# Patient Record
Sex: Female | Born: 1960 | Race: White | Hispanic: No | Marital: Married
Health system: Southern US, Community
[De-identification: ages and names within clinical notes are randomized; demographics above are authoritative.]

---

## 2000-12-30 ENCOUNTER — Encounter (INDEPENDENT_AMBULATORY_CARE_PROVIDER_SITE_OTHER): Payer: Self-pay | Admitting: Specialist

## 2000-12-30 ENCOUNTER — Ambulatory Visit (HOSPITAL_BASED_OUTPATIENT_CLINIC_OR_DEPARTMENT_OTHER): Admission: RE | Admit: 2000-12-30 | Discharge: 2000-12-30 | Payer: Self-pay | Admitting: Obstetrics and Gynecology

## 2002-04-06 ENCOUNTER — Encounter: Payer: Self-pay | Admitting: General Surgery

## 2002-04-06 ENCOUNTER — Encounter (INDEPENDENT_AMBULATORY_CARE_PROVIDER_SITE_OTHER): Payer: Self-pay | Admitting: *Deleted

## 2002-04-06 ENCOUNTER — Observation Stay (HOSPITAL_COMMUNITY): Admission: RE | Admit: 2002-04-06 | Discharge: 2002-04-07 | Payer: Self-pay | Admitting: General Surgery

## 2004-04-02 ENCOUNTER — Emergency Department (HOSPITAL_COMMUNITY): Admission: EM | Admit: 2004-04-02 | Discharge: 2004-04-02 | Payer: Self-pay | Admitting: Family Medicine

## 2004-09-13 ENCOUNTER — Ambulatory Visit (HOSPITAL_COMMUNITY): Admission: RE | Admit: 2004-09-13 | Discharge: 2004-09-13 | Payer: Self-pay | Admitting: Gastroenterology

## 2004-10-13 ENCOUNTER — Ambulatory Visit: Payer: Self-pay | Admitting: Internal Medicine

## 2004-10-13 ENCOUNTER — Inpatient Hospital Stay (HOSPITAL_COMMUNITY): Admission: EM | Admit: 2004-10-13 | Discharge: 2004-10-17 | Payer: Self-pay | Admitting: Emergency Medicine

## 2004-10-20 ENCOUNTER — Emergency Department (HOSPITAL_COMMUNITY): Admission: EM | Admit: 2004-10-20 | Discharge: 2004-10-20 | Payer: Self-pay | Admitting: Emergency Medicine

## 2004-10-22 ENCOUNTER — Emergency Department (HOSPITAL_COMMUNITY): Admission: EM | Admit: 2004-10-22 | Discharge: 2004-10-22 | Payer: Self-pay | Admitting: Family Medicine

## 2005-02-28 ENCOUNTER — Encounter: Admission: RE | Admit: 2005-02-28 | Discharge: 2005-02-28 | Payer: Self-pay | Admitting: Gastroenterology

## 2005-12-09 ENCOUNTER — Ambulatory Visit (HOSPITAL_COMMUNITY): Admission: RE | Admit: 2005-12-09 | Discharge: 2005-12-09 | Payer: Self-pay | Admitting: Gastroenterology

## 2006-06-15 ENCOUNTER — Emergency Department (HOSPITAL_COMMUNITY): Admission: EM | Admit: 2006-06-15 | Discharge: 2006-06-15 | Payer: Self-pay | Admitting: Family Medicine

## 2006-08-09 IMAGING — CT CT ABDOMEN W/ CM
1 of 3 series · 13 of 32 positions shown, 18 images · IV contrast (100 ML OMNI 300)
Comparison: None.

CLINICAL DATA: Crohn's disease.  Abdominal pain.  History of melanoma.  Swallowed EndoCapsule camera, which has not passed.  Evaluate for bowel obstruction.
ABDOMEN CT WITH CONTRAST:
TECHNIQUE: Multidetector CT imaging of the abdomen was performed following the standard protocol during bolus administration of intravenous contrast.
Contrast:  100 cc Omnipaque 300 and oral contrast.
TECHNIQUE: Multidetector CT imaging of the pelvis was performed following the standard protocol during bolus administration of intravenous contrast.
Mild dilatation of fluid-filled small bowel loops is seen in the lower abdomen and pelvis.  Bowel wall thickening is seen involving the distal ileum, consistent with the patient's diagnosis of Crohn's disease.  An EndoCapsule camera is seen within the distal portion of the ileum at approximately 25 cm proximal to the ileocecal valve. There is no evidence of free intraperitoneal air.  There is no evidence of abscess.  There is a small amount of free fluid seen in the right adnexa and pelvic cul-de-sac.  No abnormal soft tissue masses are seen.

[Series 2: routine abdomen · axial · 0.73mm/px · z∈[-425,-45]mm · 13 of 86 slices shown, 18 images]
[im 5/86  soft-tissue]
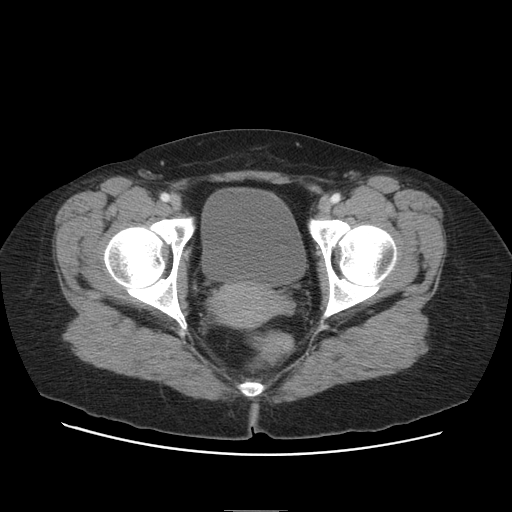
[im 5/86  bone]
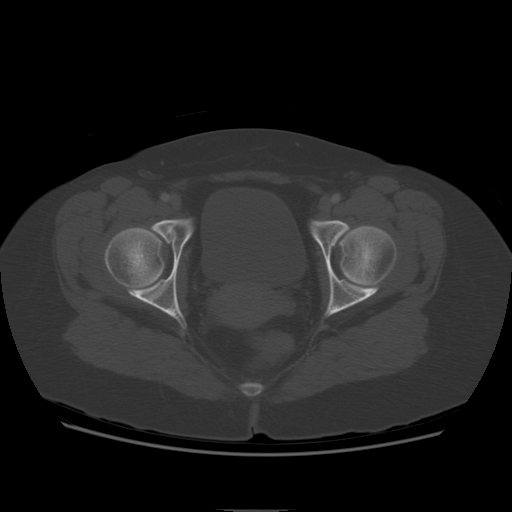
[im 14/86  soft-tissue]
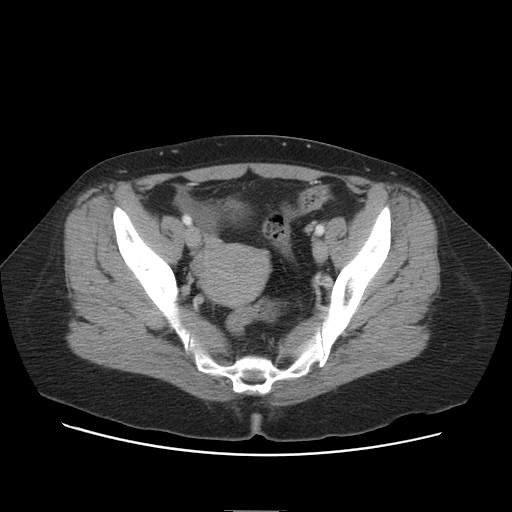
[im 18/86  soft-tissue]
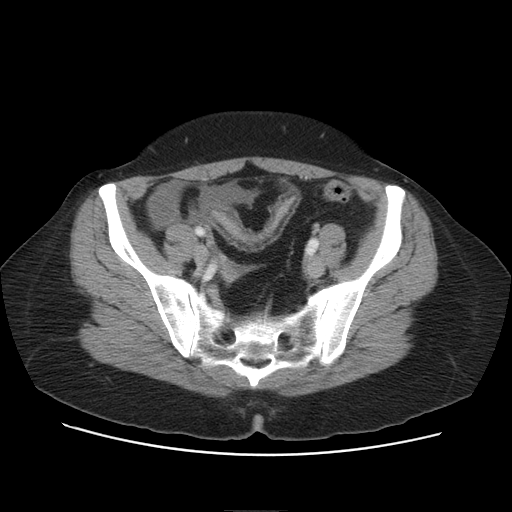
[im 27/86  soft-tissue]
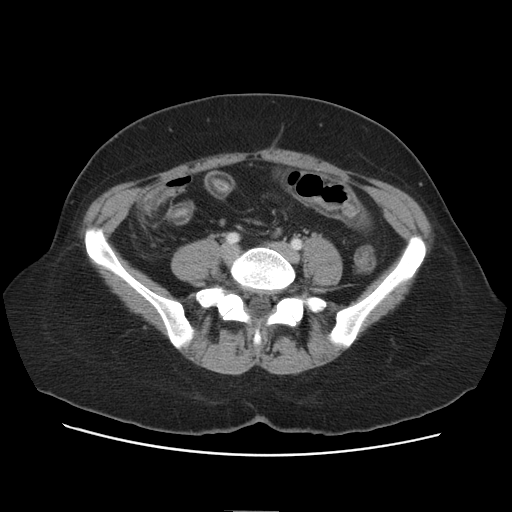
[im 32/86  soft-tissue]
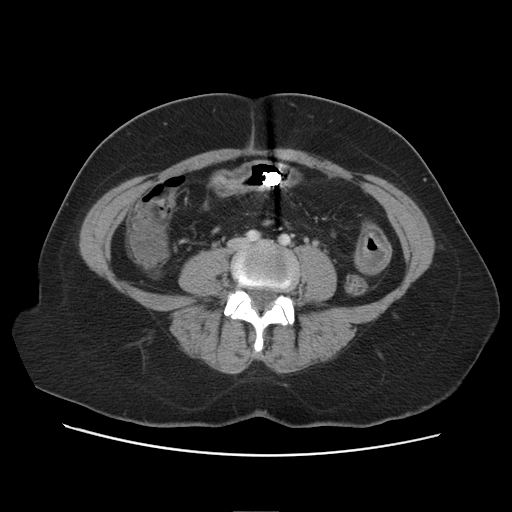
[im 41/86  soft-tissue]
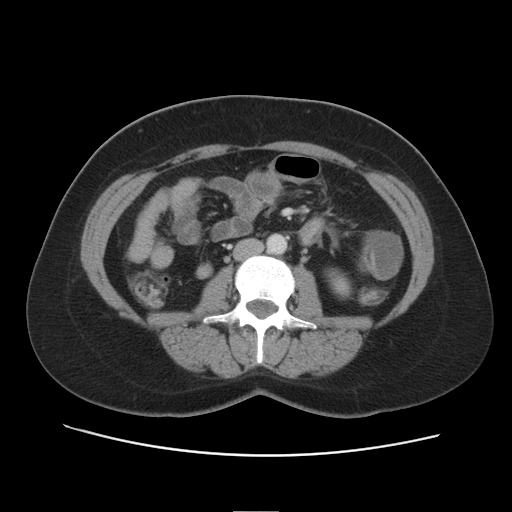
[im 45/86  soft-tissue]
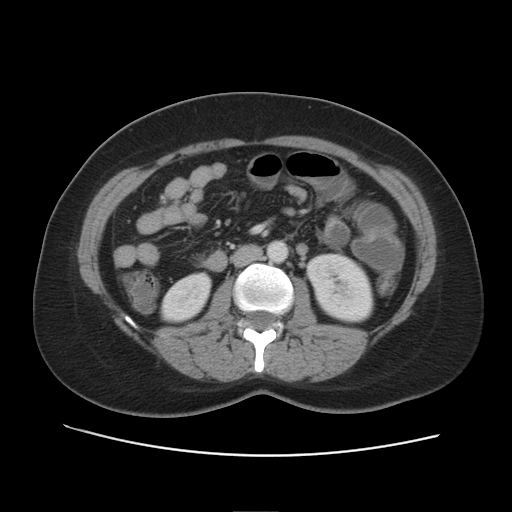
[im 54/86  soft-tissue]
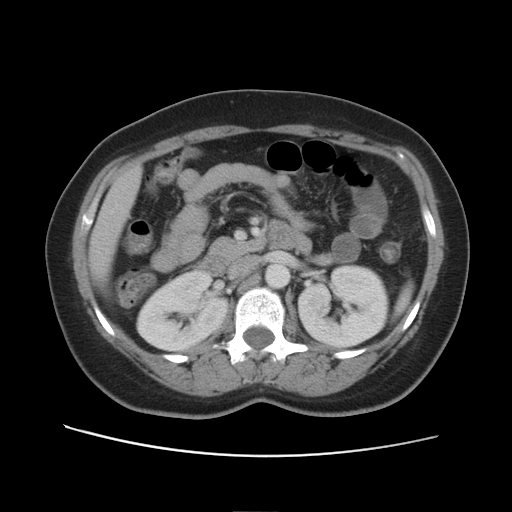
[im 59/86  soft-tissue]
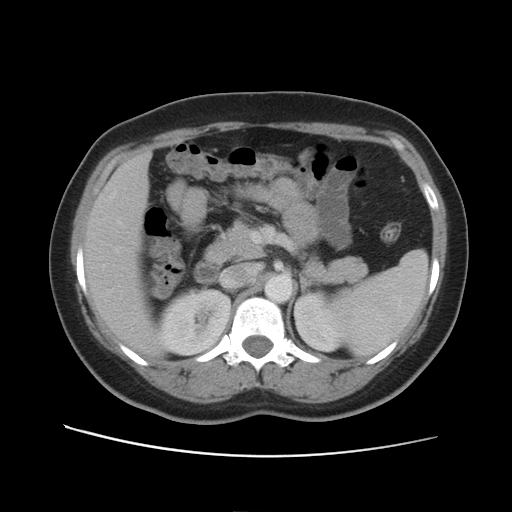
[im 59/86  bone]
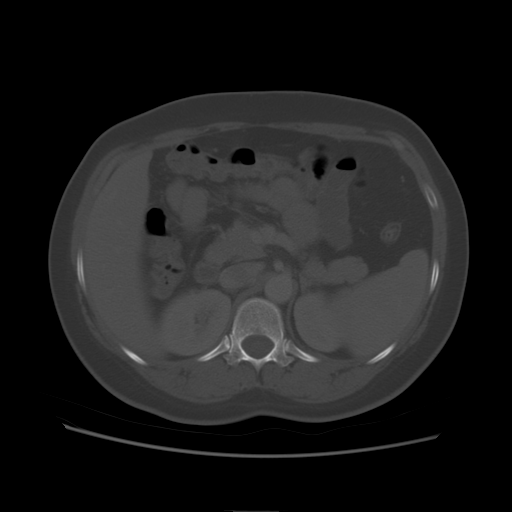
[im 68/86  soft-tissue]
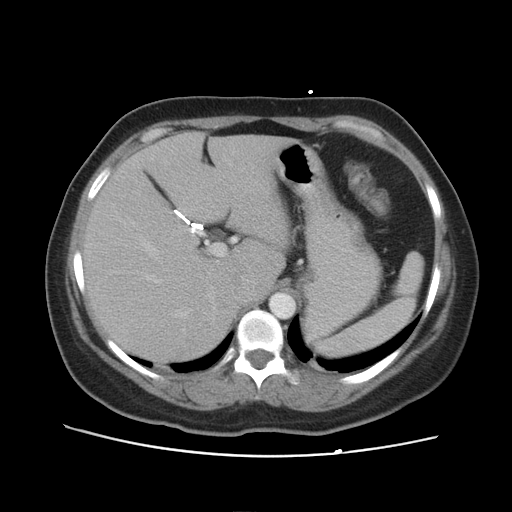
[im 68/86  lung]
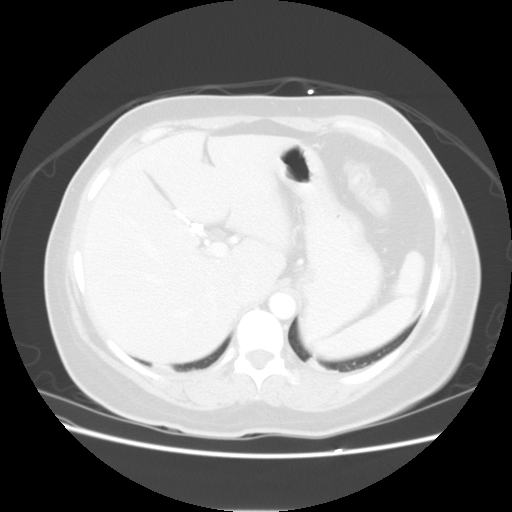
[im 72/86  soft-tissue]
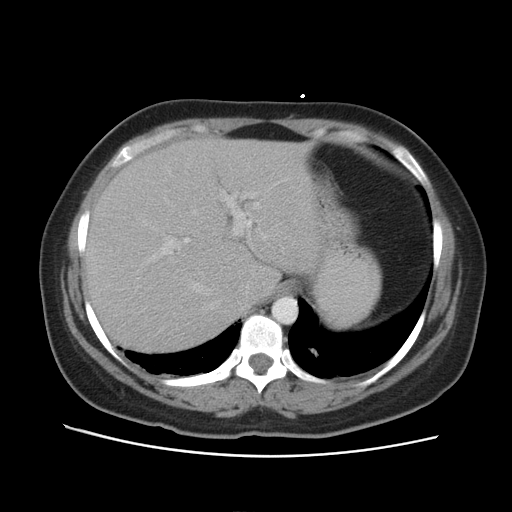
[im 72/86  lung]
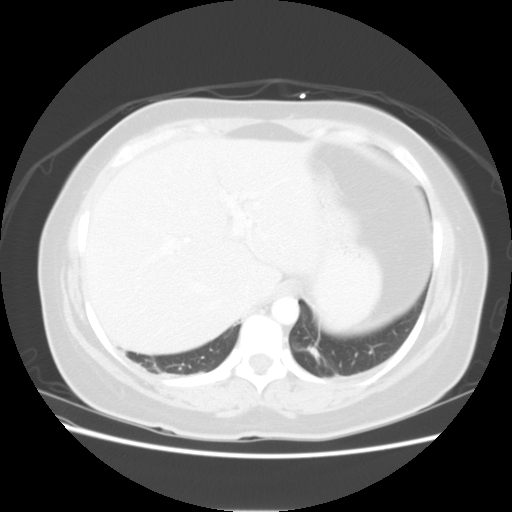
[im 77/86  lung]
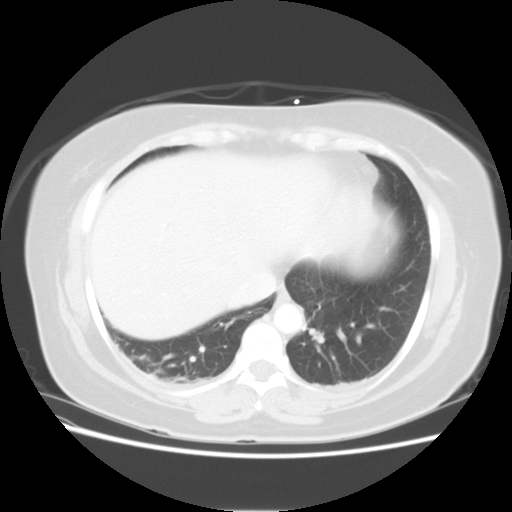
[im 81/86  soft-tissue]
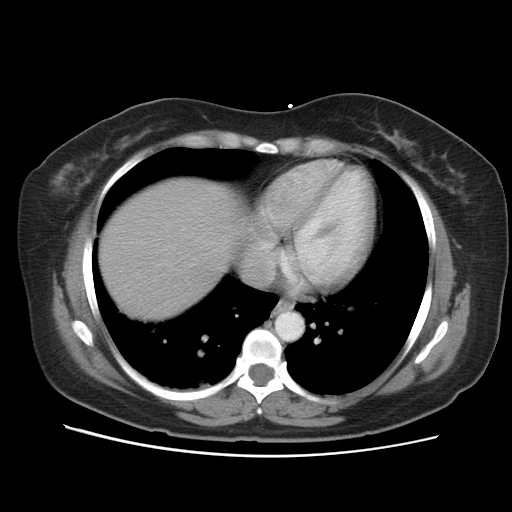
[im 81/86  lung]
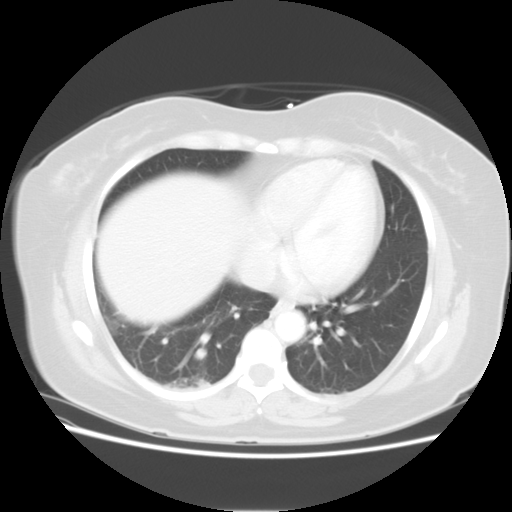

[13 of 32 positions shown; findings below may reference images not displayed]

Cervical clips are seen from prior cholecystectomy.  There is no evidence of biliary ductal dilatation.  The liver, spleen, pancreas, adrenal glands, and kidneys are normal in appearance.  There is no evidence of mass or adenopathy.  No inflammatory changes are seen within the abdomen, and there is no evidence of abnormal fluid collections.  Mild dilatation of fluid-filled small bowel loops is seen in the lower abdomen.
IMPRESSION: Mildly dilated small bowel loops in the lower abdomen.  See pelvis CT report below.
PELVIS CT WITH CONTRAST:
IMPRESSION: 1.  Small bowel wall thickening involving terminal and distal ileum, consistent with history of Crohn's disease.  Mildly dilated proximal small bowel loops, consistent with partial small bowel obstruction. 
2.  EndoCapsule camera seen in distal ileum, approximately 25 cm proximal to the ileocecal valve. 
3.  Small amount of free pelvic fluid. No evidence of abscess.

## 2006-08-10 IMAGING — CR DG ABDOMEN 1V
1 series · 1 of 1 positions shown · non-contrast
Comparison: CT 10/13/2004

CLINICAL DATA: Crohn's disease

ABDOMEN - 1 VIEW

[view not recorded]
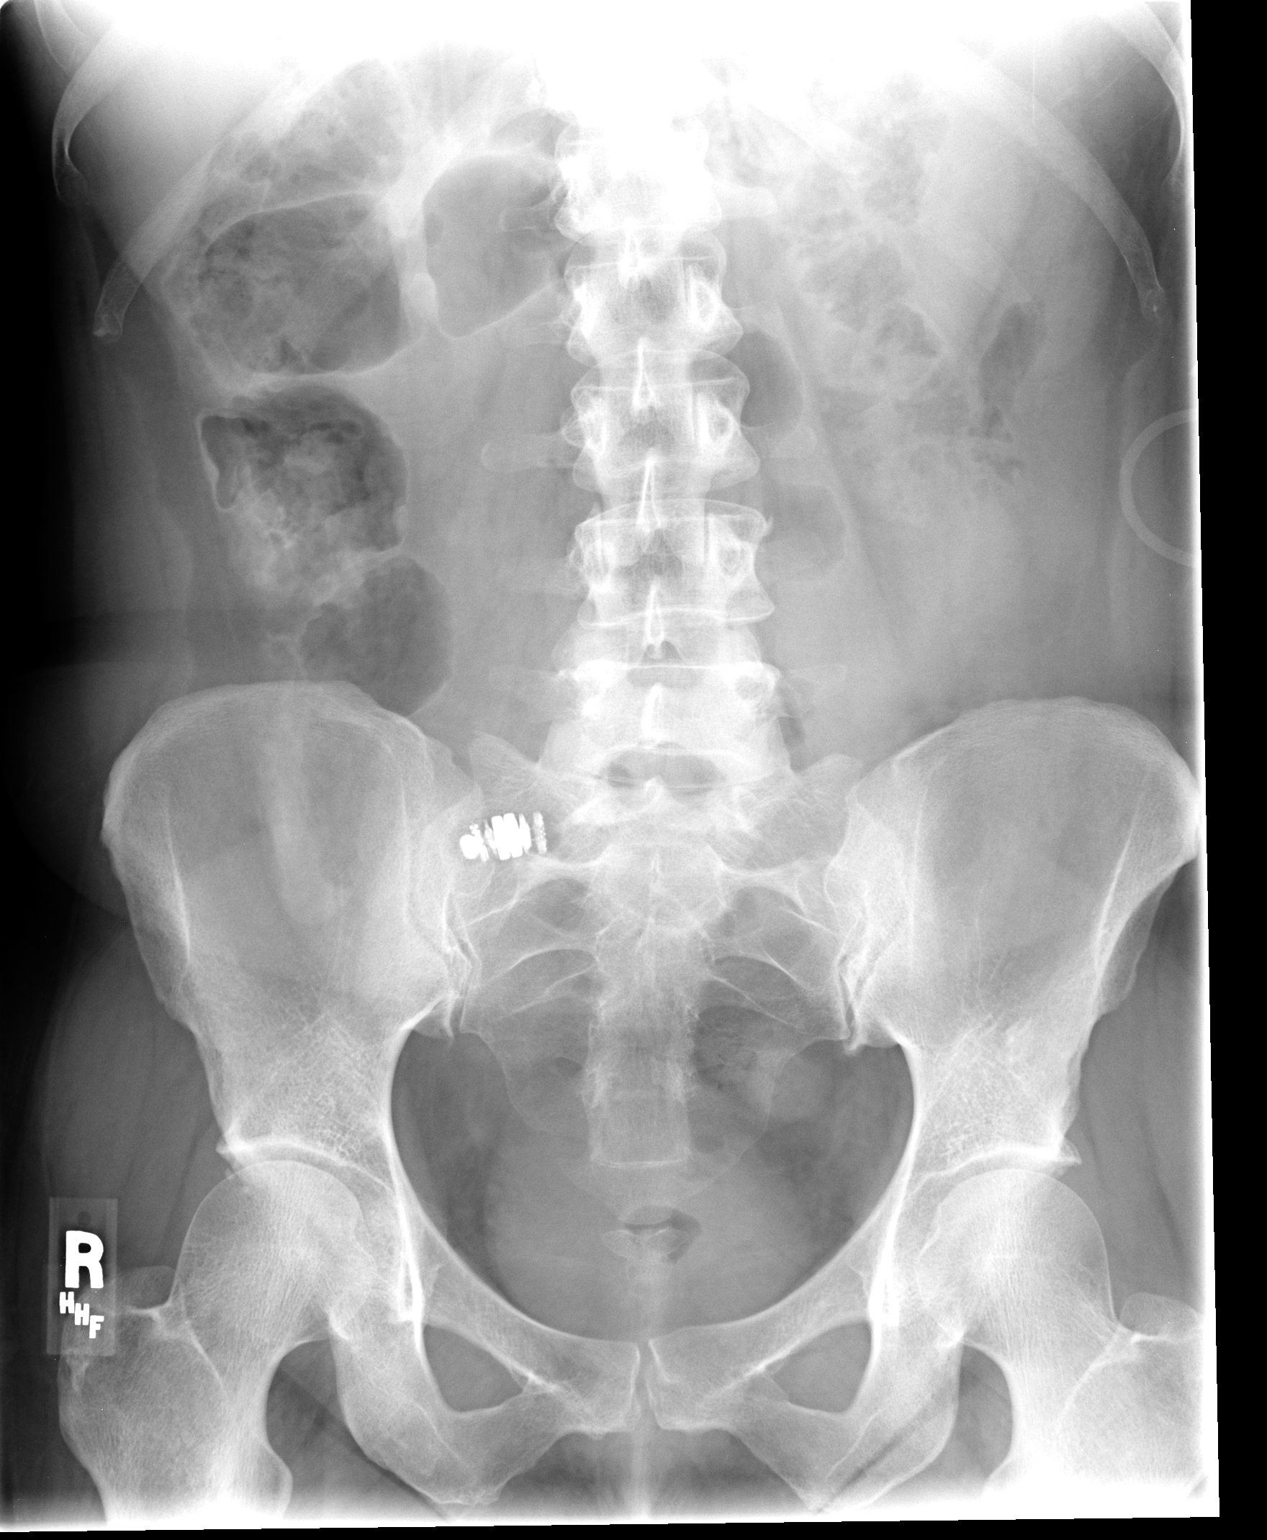

[1 of 1 positions shown; findings below may reference images not displayed]

FINDINGS: Nonobstructive bowel gas pattern. Gas noted within the colon. The
endocapsule camera is present in the right lower quadrant, further downstream
than on prior CT, but appears to still be within the distal ileum.

IMPRESSION

Camera slightly further downstream but still within the ileum. No obstruction.

## 2006-08-11 IMAGING — CR DG CHEST 1V PORT
1 series · 1 of 1 positions shown · non-contrast
Comparison: No similar prior study is available for direct comparison at this time.

CLINICAL DATA: PICC line placement.
 PORTABLE CHEST ? 1 VIEW:

[view not recorded]
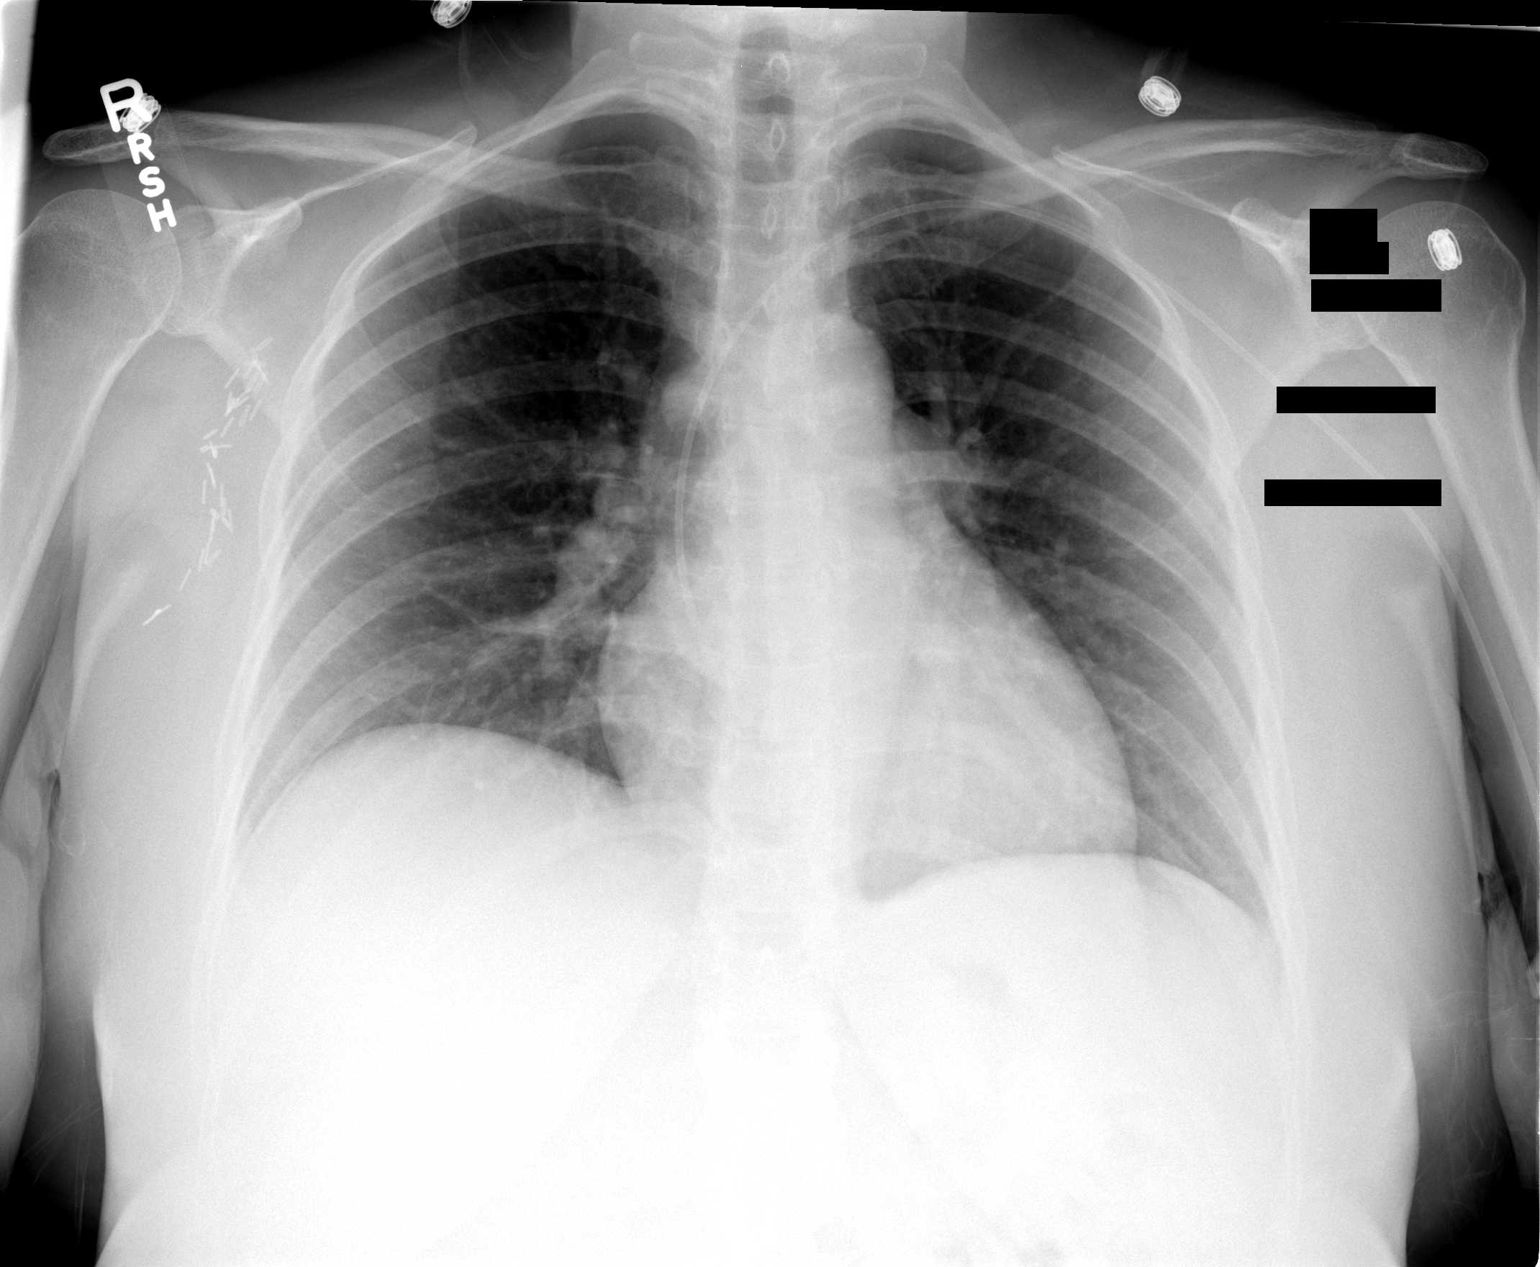

[1 of 1 positions shown; findings below may reference images not displayed]

FINDINGS: Left-sided PICC line has been placed which terminates over the right atrium.  This may be withdrawn approximately 3 cm for optimal positioning.  Cardiomediastinal silhouette is within normal limits.  Lung volumes are low, but otherwise clear.  Clips are seen over the right axilla.  No pneumothorax is present.
IMPRESSION: PICC line terminates over the right atrium and may be withdrawn approximately 3 cm for optimal positioning.

## 2007-05-09 ENCOUNTER — Emergency Department (HOSPITAL_COMMUNITY): Admission: EM | Admit: 2007-05-09 | Discharge: 2007-05-09 | Payer: Self-pay | Admitting: Emergency Medicine

## 2009-09-10 ENCOUNTER — Emergency Department (HOSPITAL_COMMUNITY): Admission: EM | Admit: 2009-09-10 | Discharge: 2009-09-10 | Payer: Self-pay | Admitting: Family Medicine

## 2010-07-12 NOTE — Op Note (Signed)
[facility]  Patient:    Nancy Macias, Nancy Macias Visit Number: [id number] MRN: [id number]          Service Type: NES Location: [facility] Attending Physician:  Lendon Colonel Dictated by:   Kathie Rhodes. Kyra Manges, M.D. Proc. Date: 12/30/00 Admit Date:  12/30/2000                             Operative Report  PREOPERATIVE DIAGNOSIS:  Menorrhagia.  POSTOPERATIVE DIAGNOSIS:  Menorrhagia.  OPERATION:  Endometrial resection.  SURGEON:  Katherine Roan, M.D.  DESCRIPTION OF PROCEDURE:  The patient was placed in the lithotomy position, prepped and draped in the usual fashion.  The cervix was small and the uterus was retroverted, dilated carefully, and hysteroscope was inserted into the endometrial cavity.  Visualization of the uterus revealed a polypoid hyperplasia posteriorly which was resected.  The endometrium was quite vascular.  All the resected material was removed and the areas underneath the resection were then cauterized.  No unusual blood loss occurred.  The relaxation was obvious, so if heavy periods continue, then a vaginal hysterectomy could easily be done.  At the termination of the procedure, there was no unusual blood loss.  She was awakened and carried to the recovery room in good condition. Dictated by:   S. Kyra Manges, M.D. Attending Physician:  Lendon Colonel DD:  12/30/00 TD:  12/31/00 Job: [id number]

## 2010-07-12 NOTE — Discharge Summary (Signed)
Nancy Macias, Nancy Macias               ACCOUNT NO.:  [id number]   MEDICAL RECORD NO.:  [id number]          PATIENT TYPE:  INP   LOCATION:  6713                         FACILITY:  [facility]   PHYSICIAN:  Ileana Roup, M.D.  DATE OF BIRTH:  [birth year]   DATE OF ADMISSION:  10/13/2004  DATE OF DISCHARGE:  10/17/2004                                 DISCHARGE SUMMARY   DISCHARGE DIAGNOSES:  1.  Small bowel obstruction secondary to retained capsule from capsule      endoscopy.  2.  Crohn's disease.  3.  Hypokalemia.  4.  Hypertension.  5.  Anemia.   DISCHARGE MEDICATIONS:  1.  Protonix 40 mg p.o. daily.  2.  Prednisone 40 mg p.o. daily.  3.  Phenergan 12.5 to 25 mg p.o. q.4h. p.r.n.  4.  The patient was instructed to hold her enterocort until she follows up      with Dr. Ewing Schlein.   DISCHARGE INSTRUCTIONS:  1.  The patient was instructed to follow up with Dr. Ewing Schlein at [facility] on October 21, 2004.  She should also get an abdominal x-      ray on October 21, 2004.  2.  She was also instructed that her blood pressure was elevated, and that      she should have this rechecked once she leaves the hospital to evaluate      her for a potential negative diagnosis of hypertension.  3.  She was also advised to return to the emergency room should there be any      return of her nausea, vomiting, or severe abdominal pain, as she is at      high likelihood for a recurrence of her small bowel obstruction, given      the fact that the camera did not pass at the time of discharge.   PROCEDURES:  Acute abdominal x-ray on October 13, 2004 shows a capsule camera  in the right lower quadrant, probably in the terminal ileum, as well as  scattered small bowel fluid levels representing a low-grade partial small  bowel obstruction related to the camera.  A CT of the abdomen and pelvis on  October 13, 2004 shows mildly dilated small bowel loops in the lower abdomen,  and small bowel wall  thickening involving the terminal and distal ileum  consistent with Crohn's, as well as mildly dilated proximal small bowel  loops consistent with a partial small bowel obstruction.  The endocapsule  camera was seen in the distal ileum approximately 25 cm proximal to the  ileocecal valve, and abdominal x-ray on October 14, 2004 shows the  endocapsule camera slightly further downstream, but still within the ileum;  no obstruction.  A portable chest x-ray on October 15, 2004 shows the PICC  line in the right atrium needing to be withdrawn 3 cm per optimal  positioning.  Abdominal x-rays on October 15, 2004, October 16, 2004, October 17, 2004, and October 20, 2004 all showed the endoscopic camera remaining in  the region of the terminal ileum.  The patient also  had a PICC line placed  on October 15, 2004.  This was discontinued at the time of discharge.   CONSULTATIONS:  1.  Dr. Luther Parody and Dr. Ewing Schlein from [facility] were consulted      in regard to the patient's Crohn's disease and retaining the endocapsule      camera from her capsule endoscopy.  2.  Dr. Daphine Deutscher and Dr. Derrell Lolling of [facility] were consulted in      the likelihood that the patient would have to have the capsule resected.      The patient did not require any surgical intervention to relieve her      small bowel obstruction due to a foreign body and complicated by the      diagnosis of Crohn's disease.   BRIEF ADMITTING HISTORY AND PHYSICAL:  Nancy Macias is a 50 year old white  female who began to have nausea, vomiting, and abdominal pain starting 2-3  days prior to admission.  The pain became increasingly worse, and she  vomited several times on the day prior to admission.  She does report  recently being diagnosed with Crohn's disease on October 04, 2004 by Dr.  Kinnie Scales, and stated that this diagnosis was made by capsule endoscopy, and  that she did not notice passing the capsule endoscope.   PHYSICAL  EXAMINATION ON ADMISSION:  VITAL SIGNS:  The patient, on admission,  had a temperature of 98.0, blood pressure 145/80, pulse 103, respirations  20, 97% on room air.  GENERAL:  She was in moderate distress, awake and alert, but vomiting and  dry heaving during the exam.  HEENT:  Eyes - pupils equal, reactive to light.  Extraocular muscles were  intact.  Sclerae were clear.  ENT showed nares and oropharynx that were  clear.  NECK:  Supple, nontender.  No thyromegaly.  LUNGS:  Clear to auscultation bilaterally.  CARDIOVASCULAR:  A regular rate and rhythm with no murmurs.  Occasionally  tachycardic.  ABDOMEN:  An abdomen that was soft with good bowel sounds, but diffusely  tender in all 4 quadrants.  No hepatosplenomegaly.  EXTREMITIES:  Without edema.  NEUROLOGIC:  Cranial nerves II-XII were intact.  Strength 5/5, and sensation  intact.   ADMISSION LABORATORIES:  White blood cell count of 11.9, hemoglobin 13.1,  hematocrit 40, platelets 431.  Sodium 137, potassium 3.2, chloride 106,  bicarbonate 23, BUN 12, creatinine 0.6, and platelets of 126.  Total  bilirubin 0.4, alkaline phosphatase 107, SGOT 13, SGPT 21, protein 6.4,  albumin 2.9.  An abdominal x-ray showed fluid levels and borderline  distention with small bowel obstruction with the capsule camera in the right  lower quadrant.   HOSPITAL COURSE BY PROBLEM:  1.  Small bowel obstruction.  The small bowel obstruction was felt to be      secondary to a retained capsule endoscope camera, as well as her      underlying Crohn's disease.  An abdominal plain film did show air fluid      levels consistent with small bowel obstruction, as well as the retained      endoscopic camera in the right lower quadrant.  A CT confirmed these      findings, and placed the endoscopic camera approximately 25 cm proximal      to the ileocecal valve.  In regards to the management of her small bowel     obstruction, the patient was placed NPO.  Her nausea  and vomiting were  controlled with Phenergan and Zofran, and her pain was controlled with      morphine.  she was started on Solu-Medrol 60 mg IV daily.  Attempts were      made to pass an NG tube; however, after several attempts in both      nostrils with multiple tube sizes including pediatric tubes, the NG tube      could not be placed.  At this point, GI and surgery consults were      placed.  Dr. Luther Parody came and evaluated the patient, at which point      she was feeling better and was no longer nauseated and no longer      vomiting.  He recommended holding the NG tube, but with resumed attempts      to place it, should the patient have a recurrence of her nausea and      vomiting.  He also recommended increasing the Solu-Medrol to 60 mg IV      q.12h., which was done, and he also added Cipro and Flagyl IV as further      management of her Crohn's disease.  During the hospitalization, she was      followed with daily abdominal films to track any movement of the      endoscopic camera.  Her diet was advanced as tolerated, and her stools      were checked for any evidence of passing of the camera.  She was      eventually changed over to oral prednisone 40 mg daily, and during the      course of her hospitalization never did pass the endoscopic camera.  At      the time of discharge, she was feeling very well with no nausea, no      vomiting, and desired to go home.  In discussions with Dr. Ewing Schlein, it was      felt that she could follow up with him as an outpatient, and continue to      get frequent abdominal films for any evidence of passing of the camera.      She was instructed to return immediately to the emergency room if she      should have any recurrence of the nausea, vomiting, and abdominal pain      that brought her in during this hospitalization in the first place.      Again, the patient will follow up with Dr. Ewing Schlein in regards to further      management of her Crohn's  disease, as well as management of how to      retrieve the capsule camera.  2.  Crohn's disease.  The patient came in with a recent diagnosis of Crohn's      disease on enterocort.  The enterocort was held, and the Crohn's disease      was managed with IV Solu-Medrol, Cipro, and Flagyl.  At the time of      discharge, the patient was sent out on prednisone 40 mg daily.  The      Flagyl and Cipro were discontinued.  Her white blood cell count trended      down during the course of the hospitalization, and she will follow up      with Dr. Ewing Schlein for further management of her Crohn's disease.  3.  Hypokalemia.  The patient came in hypokalemic with a potassium of 3.2.      This was repleted several times during the course  of her      hospitalization, and felt to be due to her nausea and vomiting.  Her      potassium fully trended up and was stable at the time of discharge.  4.  Volume depletion secondary to nausea and vomiting.  The patient was     placed on IV fluids and was felt to be euvolemic at the time of      discharge.  5.  Hypertension.  The patient was admitted with a blood pressure of 145/80,      and no previous diagnosis of hypertension.  Her blood pressure as within      normal limits throughout the course of her hospitalization until the day      of discharge, at which time her blood pressure was systolics in the 150s      to 180s.  There was felt to be some component of acute stress, and in a      patient with no previous history of hypertension, we felt it would be      best if she had her blood pressure rechecked on an outpatient basis to      confirm a diagnosis of hypertension before being started on any      antihypertensives.  She will have her blood pressure checked by Dr.      Ewing Schlein at her hospital follow up appointment.  6.  Anemia.  The patient came in with a normal hemoglobin of 13.1; however,      after receiving large amounts of IV fluids, her hemoglobin trended  down      to a nadir of 9.8.  However, at the time of discharge, the patient's      hemoglobin had risen to 11.1 without any intervention.  She does have a      history of anemia, and received B-12 shots as an outpatient.  One B-12      shot was given in house, and she should have this followed up on as an      outpatient.   DISCHARGE LABORATORIES AND VITALS:  At the time of discharge, the patient  had a CBC which showed a white blood cell count of 11.2, hemoglobin 11.1,  hematocrit 33.1, platelets of 335.  A BMET showed a sodium of  141, potassium 3.9, chloride 106, bicarbonate 26, BUN 8, creatinine 0.6, and  a glucose of 141 with a calcium of 8.4.  Her vitals on the day of discharge  include a temperature of 98.3, pulse 65, respirations 18, and a blood  pressure of 183/93.  Oxygen saturations were 99% on room air.  There are no  pending laboratories at the time of discharge.      Inis Sizer, M.D.    ______________________________  Ileana Roup, M.D.    DC/MEDQ  D:  11/06/2004  T:  11/06/2004  Job:  [id number]   cc:   Outpatient Clinic   Althea Grimmer. Luther Parody, M.D.  [address]  [place]  [place] [address]  Fax: [phone]   Petra Kuba, M.D.  [address]  [place]  [place] [address]  Fax: [phone]   Dr. Daphine Deutscher   Dr. Derrell Lolling, [facility]   Griffith Citron, M.D.  [facility] [place]  [place] [address]  Fax: [phone]

## 2010-07-12 NOTE — Consult Note (Signed)
NAMECERINITY, Nancy Macias               ACCOUNT NO.:  [id number]   MEDICAL RECORD NO.:  [id number]          PATIENT TYPE:  INP   LOCATION:  3315                         FACILITY:  [facility]   PHYSICIAN:  Althea Grimmer. Santogade, M.D.DATE OF BIRTH:  1960/08/30   DATE OF CONSULTATION:  DATE OF DISCHARGE:                                   CONSULTATION   DATE OF GASTROENTEROLOGY CONSULTATION:  October 13, 2004.   REASON FOR CONSULTATION:  Nancy Macias is a 50 year old female, who presented  to the emergency room with abdominal distention, bloating, nausea, vomiting,  and pain in the mid right abdomen mostly.  She reports that she has been  diagnosed with Crohn's disease of the terminal ileum on capsule endoscopy  performed by Dr. Kinnie Scales on July 21st.  An x-ray in the emergency room shows  a small bowel obstruction with the capsule in the terminal ileal area.  A  followup CT scan demonstrates that the capsule is lodged a few loops  proximal to the terminal ileum.  Patient has not passed gas today.  She  passed a small liquid stool early this morning.  She reports that the  vomiting started Friday night and got progressively worse.  Within the last  hour, she had significant vomiting and feels considerably better at present.  Several attempts at NG-tube were unsuccessful.  She reports to me that she  has had gastrointestinal problems for years with pain on the right side.  Upper and lower endoscopy reportedly were negative by Dr. Kinnie Scales in  approximately 2002.  In 2004, a gallstone was discovered and she underwent  laparoscopic cholecystectomy.  She seemed better briefly, but then the pains  recurred, and the diagnosis was uncertain until the capsule endoscopy last  month.  She has not vomited any blood or had any hematochezia.  She is not  running any fevers or having chills.   PAST MEDICAL HISTORY:  1.  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  2.  Microhematuria.  3.  Status post laparoscopic  cholecystectomy.   CURRENT MEDICATIONS:  1.  Anticort 3 mg, three q.a.m.  2.  In hospital, she has been started on Solu-Medrol 60 mg daily and Zofran.   ALLERGIES:  None reported.   FAMILY HISTORY:  Negative for inflammatory bowel disease.   REVIEW OF SYSTEMS:  Negative other than above.   PHYSICAL EXAMINATION:  VITAL SIGNS:  She is afebrile.  Blood pressure  124/86, pulse 110.  GENERAL:  She is in no acute distress.  HEENT:  Eyes anicteric.  Oropharynx unremarkable without aphthous  ulcerations.  CHEST:  Clear.  HEART:  Regular rate and rhythm without murmurs.  ABDOMEN:  Currently soft with hypoactive bowel sounds.  There is minimal  tenderness in the right lower quadrant.  RECTAL:  Exam is not performed.  EXTREMITIES:  Without cyanosis, clubbing, edema, or rash.   LABORATORY DATA:  Hemoglobin of 13.1, white blood count of 11.9, platelet  count 439.  Sedimentation rate 13, potassium 3.2 being repleted, albumin  2.9.   IMPRESSION:  A 50 year old female with apparent Crohn's disease  with  stricturing or inflammatory edema of the terminal ileum.  The capsule  endoscope is apparently obstructing the distal non-terminal ileum.   RECOMMENDATIONS:  1.  Re-attempt insertion of the NG-tube if she again becomes distended,      nauseous, or begins vomiting.  2.  Increase Solu-Medrol to b.i.d.  3.  Begin IV ciprofloxacin and Flagyl.  4.  Surgical consultation.  5.  Consider Remicade, which could decrease inflammation rapidly if this is      secondary to inflammation and edema.  However, if steroids and      antibiotics do not alleviate the obstruction, it is unlikely Remicade      will do so and the only alternative will be surgery to remove the      capsule and probably resect the strictured terminal ileum.      Althea Grimmer. Luther Parody, M.D.  Electronically Signed     PJS/MEDQ  D:  10/13/2004  T:  10/13/2004  Job:  [id number]   cc:   Griffith Citron, M.D.  [facility]  [place]  [place] [address]  Fax: [phone]

## 2010-07-12 NOTE — Op Note (Signed)
NAMEASHAKI, FROSCH NO.:  [id number]   MEDICAL RECORD NO.:  [id number]            PATIENT TYPE:   LOCATION:                                 FACILITY:   PHYSICIAN:  Petra Kuba, M.D.         DATE OF BIRTH:   DATE OF PROCEDURE:  12/09/2005  DATE OF DISCHARGE:                                 OPERATIVE REPORT   SURGEON:  Petra Kuba, M.D.   PROCEDURE:  Esophagogastroduodenoscopy.   INDICATIONS:  Reflux and cough, want to evaluate.   CONSENT:  Consent was signed prior to any premeds given, after the risks,  benefits, methods and options were thoroughly discussed multiple times in  the past.   ADDITIONAL MEDICINES:  No additional medicines were used for this procedure.   DESCRIPTION OF PROCEDURE:  The video endoscope was inserted by direct  vision.  The esophagus was notable for a minimal amount of reflux change.  She did have a small-to-medium-size hiatal hernia with a widely patent and  fibrous ring.  The scope was passed into the stomach and advanced through a  normal antrum, normal pylorus, and into a normal duodenal bulb and around  the C loop to a normal second portion of the duodenum.  The scope was  withdrawn back to the bulb, and a good look there ruled out abnormalities in  that location.  The scope was withdrawn back into the stomach and  retroflexed.  High in the cardia, the hiatal hernia was confirmed.  The  angularis, fundus, lesser and greater curve were normal on retroflex  visualization.  Straight visualization of the stomach did not reveal any  additional findings.  Air was suctioned.  The scope was slowly withdrawn.  Again, a good look at the esophagus was confirmed the above findings.  The  scope was removed.  The patient tolerated the procedure well.  There was no  obvious immediate complication.   ENDOSCOPIC DIAGNOSES:  1. Small-to-medium-size hiatal hernia with a widely patent, fibrous ring.  2. Otherwise, within normal limits  EGD.   PLAN:  Will try pump inhibitors and see her back in 6 weeks to see if that  helps her symptoms.  Consider pulmonary or ENT consults if her symptoms  continue for chronic cough.  Possibly even increase her pump inhibitors  further and have her call me sooner p.r.n.           ______________________________  Petra Kuba, M.D.     MEM/MEDQ  D:  12/09/2005  T:  12/09/2005  Job:  [id number]   cc:   Lavonda Jumbo, M.D.

## 2010-07-12 NOTE — Op Note (Signed)
Nancy Macias, HEINY NO.:  [id number]   MEDICAL RECORD NO.:  [id number]            PATIENT TYPE:   LOCATION:                                 FACILITY:   PHYSICIAN:  Petra Kuba, M.D.         DATE OF BIRTH:   DATE OF PROCEDURE:  12/09/2005  DATE OF DISCHARGE:                                 OPERATIVE REPORT   SURGEON:  Petra Kuba, M.D.   PROCEDURE:  Colonoscopy.   INDICATIONS:  Crohn, want to reevaluate.   CONSENT:  Consent was signed after the risks, benefits, methods and options  were thoroughly discussed in the office multiple times in the past.   MEDICINES USED:  Fentanyl 120 mcg, Versed 10 mg.   DESCRIPTION OF PROCEDURE:  Rectal inspection was pertinent for external  hemorrhoids, small.  Digital exam was negative.  The pediatric video  adjustable colonoscope was inserted, easily advanced to the proximal level  of the midtransverse with some looping.  Abdominal pressure was applied and  we were easily able to advance to the cecum, which was identified by the  appendiceal orifice and the ileocecal valve.  We could advance into the  ileocecal valve and see the normal-appearing TI, but we could not advance  with abdominal pressure deeper into the TI.  We did try rolling her on her  back and rolling her on her right side; but, again, due to looping, could  only advance into the opening of the ileocecal valve and could not advance  deep into the TI.  The portion of the distal TI seen was normal.  She did  have some scarring of ileocecal valve, but no signs of active Crohn.  On  slow withdrawal through the colon, the prep was adequate.  There was minimal  liquid stool that required washing and suctioning.  The colon was normal,  without signs of Crohn or any other abnormalities, as we slowly withdrew  back to the rectum.  Anorectal pull-through and retroflexion confirmed some  small hemorrhoids.  The scope was straightened and readvanced slowly through  the left side of the colon.  Air was suctioned and the scope removed.  The  patient tolerated the procedure well.  There was no obvious immediate  complication.   ENDOSCOPIC DIAGNOSES:  1. Internal and external small hemorrhoids.  2. 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  3. Otherwise, within normal limits to the cecum.   PLAN:  Recheck colon screening probably at age 7, unless needed sooner  p.r.n.  Continue work up with an EGD.  Please see that dictation for further  recommendations, workup and plans.           ______________________________  Petra Kuba, M.D.     MEM/MEDQ  D:  12/09/2005  T:  12/09/2005  Job:  [id number]   cc:   Lavonda Jumbo, M.D.

## 2010-07-12 NOTE — Op Note (Signed)
Nancy Macias, Nancy Macias                         ACCOUNT NO.:  [id number]   MEDICAL RECORD NO.:  [id number]                   PATIENT TYPE:  AMB   LOCATION:  DAY                                  FACILITY:  [facility]   PHYSICIAN:  Sharlet Salina T. Hoxworth, M.D.          DATE OF BIRTH:  [birth year]   DATE OF PROCEDURE:  04/06/2002  DATE OF DISCHARGE:                                 OPERATIVE REPORT   PREOPERATIVE DIAGNOSIS:  Symptomatic cholelithiasis.   POSTOPERATIVE DIAGNOSIS:  Symptomatic cholelithiasis.   SURGICAL PROCEDURE:  Laparoscopic cholecystectomy.   SURGEON:  Lorne Skeens. Hoxworth, M.D.   ASSISTANT:  Donnie Coffin. Samuella Cota, M.D.   ANESTHESIA:  General.   BRIEF HISTORY:  The patient is a 50 year old white female white female with  worsening episodic right upper quadrant abdominal pain.  Workup has included  a gallbladder ultrasound showing a single 2 cm gallstone and normal common  bile duct.  LFTs have been within normal limits.  Laparoscopic  cholecystectomy has been recommended.  The nature of the procedure, its  indications, and risks of bleeding, infection, bile duct injury were  discussed and understood preoperatively.  She is now brought to the  operating room for this procedure.   DESCRIPTION OF PROCEDURE:  The patient was brought to the operating room,  placed in the supine position on the operating table and general  endotracheal anesthesia was induced.  She received preoperative antibiotics.  The PSA hose were in place.  The abdomen was sterilely prepped and draped.  PSAs were in place.  Local anesthesia was used to infiltrate the trocar  sites prior to the incision.  A 1 cm incision was made in the umbilicus and  dissection carried down to the midline fascia, which was sharply incised for  1 cm.  The peritoneum was entered under direct vision.  Through a mattress  suture of 0 Vicryl, the Hasson trocar was placed and pneumoperitoneum  established.  Under direct vision, a  10 mm trocar was placed in the  subxiphoid area and two 5 mm trocars in line with the right subcostal  margin.  The gallbladder was visualized and did not appear acutely inflamed.  The fundus was grasped and elevated up over the liver and the infundibulum  retracted inferior laterally.  The peritoneum anterior and posterior to  Calot's triangle was incised.  Fibrofatty tissue was taken off the neck of  the gallbladder toward the port of hepatitis.  The distal gallbladder and  Calot's triangle was thoroughly dissection.  The cystic duct/gallbladder  junction was dissected 360 degrees, and the cystic duct dissected out over  about 1 cm.  Anterior and posterior branches of the cystic artery and  portions above the gallbladder wall.  When the anatomy was clear, the cystic  duct was doubly clipped proximally, clipped distally and divided.  Individual branches of the cystic artery were doubly clipped proximally and  distally close to the  gallbladder and divided.  The gallbladder was then  dissected free from its bed using hook cautery and removed intact from the  umbilicus.  Complete hemostasis was assured at the operative site.  Trocars  were removed under direct vision.  All CO2 was evacuated from the peritoneal  cavity.  The mattress suture  was secured to the umbilicus.  Skin incisions were closed with interrupted 4-  0 subcuticular Monocryl and Steri-Strips.  Sponge, needle and instrument  counts were correct.  Dry sterile dressings were applied.  The patient was  taken to recovery in good condition.                                                Lorne Skeens. Hoxworth, M.D.    Tory Emerald  D:  04/06/2002  T:  04/06/2002  Job:  [id number]

## 2013-03-11 ENCOUNTER — Other Ambulatory Visit: Payer: Self-pay | Admitting: Gastroenterology

## 2013-03-11 DIAGNOSIS — R109 Unspecified abdominal pain: Secondary | ICD-10-CM

## 2013-03-18 ENCOUNTER — Ambulatory Visit
Admission: RE | Admit: 2013-03-18 | Discharge: 2013-03-18 | Disposition: A | Discharge status: Home / Self Care | Payer: 59 | Source: Ambulatory Visit | Attending: Gastroenterology | Admitting: Gastroenterology

## 2013-03-18 DIAGNOSIS — R109 Unspecified abdominal pain: Secondary | ICD-10-CM

## 2013-03-18 MED ORDER — IOHEXOL 300 MG/ML  SOLN
100.0000 mL | Freq: Once | INTRAMUSCULAR | Status: AC | PRN
  Administered 2013-03-18: 100 mL via INTRAVENOUS

## 2023-05-04 DIAGNOSIS — K219 Gastro-esophageal reflux disease without esophagitis: Secondary | ICD-10-CM | POA: Diagnosis not present

## 2023-05-04 DIAGNOSIS — K5080 Crohn's disease of both small and large intestine without complications: Secondary | ICD-10-CM | POA: Diagnosis not present

## 2024-01-27 DIAGNOSIS — H1013 Acute atopic conjunctivitis, bilateral: Secondary | ICD-10-CM | POA: Diagnosis not present

## 2024-01-27 DIAGNOSIS — J019 Acute sinusitis, unspecified: Secondary | ICD-10-CM | POA: Diagnosis not present

## 2024-02-19 DIAGNOSIS — N390 Urinary tract infection, site not specified: Secondary | ICD-10-CM | POA: Diagnosis not present

## 2024-02-19 DIAGNOSIS — A499 Bacterial infection, unspecified: Secondary | ICD-10-CM | POA: Diagnosis not present
